# Patient Record
Sex: Female | Born: 1960 | Race: Asian | Hispanic: No | Smoking: Never smoker
Health system: Southern US, Community
[De-identification: ages and names within clinical notes are randomized; demographics above are authoritative.]

## PROBLEM LIST (undated history)

## (undated) DIAGNOSIS — M1990 Unspecified osteoarthritis, unspecified site: Secondary | ICD-10-CM

## (undated) DIAGNOSIS — E7800 Pure hypercholesterolemia, unspecified: Secondary | ICD-10-CM

## (undated) DIAGNOSIS — C3490 Malignant neoplasm of unspecified part of unspecified bronchus or lung: Secondary | ICD-10-CM

## (undated) DIAGNOSIS — C50919 Malignant neoplasm of unspecified site of unspecified female breast: Secondary | ICD-10-CM

## (undated) HISTORY — PX: NEPHRECTOMY TRANSPLANTED ORGAN: SUR880

---

## 2021-10-15 ENCOUNTER — Emergency Department (HOSPITAL_COMMUNITY): Payer: PRIVATE HEALTH INSURANCE

## 2021-10-15 ENCOUNTER — Other Ambulatory Visit: Payer: Self-pay

## 2021-10-15 ENCOUNTER — Emergency Department (HOSPITAL_COMMUNITY)
Admission: EM | Admit: 2021-10-15 | Discharge: 2021-10-15 | Disposition: A | Discharge status: Home / Self Care | Payer: PRIVATE HEALTH INSURANCE | Attending: Emergency Medicine | Admitting: Emergency Medicine

## 2021-10-15 ENCOUNTER — Encounter (HOSPITAL_COMMUNITY): Payer: Self-pay

## 2021-10-15 DIAGNOSIS — M545 Low back pain, unspecified: Secondary | ICD-10-CM | POA: Insufficient documentation

## 2021-10-15 HISTORY — DX: Malignant neoplasm of unspecified part of unspecified bronchus or lung: C34.90

## 2021-10-15 HISTORY — DX: Unspecified osteoarthritis, unspecified site: M19.90

## 2021-10-15 HISTORY — DX: Pure hypercholesterolemia, unspecified: E78.00

## 2021-10-15 HISTORY — DX: Malignant neoplasm of unspecified site of unspecified female breast: C50.919

## 2021-10-15 MED ORDER — LIDOCAINE 5 % EX PTCH: 1.0000 | MEDICATED_PATCH | CUTANEOUS | 0 refills | Status: AC

## 2021-10-15 MED ORDER — HYDROMORPHONE HCL 1 MG/ML IJ SOLN
1.0000 mg | Freq: Once | INTRAMUSCULAR | Status: AC
  Administered 2021-10-15: 1 mg via INTRAMUSCULAR
  Filled 2021-10-15: qty 1

## 2021-10-15 MED ORDER — LIDOCAINE 5 % EX PTCH
1.0000 | MEDICATED_PATCH | CUTANEOUS | Status: DC
  Administered 2021-10-15: 1 via TRANSDERMAL
  Filled 2021-10-15: qty 1

## 2021-10-15 MED ORDER — PREDNISONE 20 MG PO TABS
40.0000 mg | ORAL_TABLET | Freq: Every day | ORAL | 0 refills | Status: AC
Start: 2021-10-15 — End: ?

## 2021-10-15 MED ORDER — OXYCODONE-ACETAMINOPHEN 5-325 MG PO TABS
1.0000 | ORAL_TABLET | Freq: Once | ORAL | Status: AC
  Administered 2021-10-15: 1 via ORAL
  Filled 2021-10-15: qty 1

## 2021-10-15 MED ORDER — CYCLOBENZAPRINE HCL 10 MG PO TABS
10.0000 mg | ORAL_TABLET | Freq: Three times a day (TID) | ORAL | 0 refills | Status: AC | PRN
Start: 2021-10-15 — End: ?

## 2021-10-15 NOTE — ED Provider Notes (Signed)
Bethany Burch  Arrival date & time: 10/15/21     Chief Complaint   Back Pain   History of Present Illness   Bethany Burch is a 61 y.o. year-old female presents to the ED with chief complaint of right sided low back pain.  She states that he grandson jumped on her low back about 3 days ago.  She has had gradually worsening pain.  She has tried ice, heat, naproxen, and Voltaren without relief.  She denies bowel or bladder incontinence.  Denies any weakness, numbness, or tingling, but does state that she has need some assistance walking because of the pain.  History provided by patient.   Review of Systems  Pertinent review of systems noted in HPI.    Physical Exam   Vitals:   10/15/21 2015 10/15/21 2215  BP: (!) 156/100 (!) 147/89  Pulse: 77 66  Resp: 19 19  Temp: (!) 97.5 F (36.4 C)   SpO2: 94% 99%    CONSTITUTIONAL:  non toxic-appearing, NAD NEURO:  Alert and oriented x 3, CN 3-12 grossly intact EYES:  eyes equal and reactive ENT/NECK:  Supple, no stridor  CARDIO:  normal rate, appears well-perfused, intact dorsalis pedis pulses PULM:  No respiratory distress, CTAB, no wheezing GI/GU:  non-distended,  MSK/SPINE:  No gross deformities, moderate right lumbar paraspinal muscle tenderness, no step off, able to lift and hold alternating legs off of the bed, normal strength in lower extremities, normal sensation in feet SKIN:  no rash, atraumatic   *Additional and/or pertinent findings included in MDM below  Diagnostic and Interventional Summary    EKG Interpretation  Date/Time:    Ventricular Rate:    PR Interval:    QRS Duration:   QT Interval:    QTC Calculation:   R Axis:     Text Interpretation:         Labs Reviewed - No data to display  CT Lumbar Spine Wo Contrast  Final Result      Medications  HYDROmorphone (DILAUDID) injection 1 mg (has no administration in time range)   lidocaine (LIDODERM) 5 % 1 patch (has no administration in time range)  oxyCODONE-acetaminophen (PERCOCET/ROXICET) 5-325 MG per tablet 1 tablet (1 tablet Oral Given 10/15/21 2104)     Procedures  /  Critical Care Procedures  ED Course and Medical Decision Making  I have reviewed the triage vital signs, the nursing notes, and pertinent available records from the EMR.  Social Determinants Affecting Complexity of Care: Patient has no clinically significant social determinants affecting this chief complaint..   ED Course:   Patient here with low back pain.  Top differential diagnoses include muscle strain, lumbar radiculopathy, contusion. Medical Decision Making Patient here with right low back pain x 3 days after her grandson jumped on her back.  CT is negative for fracture, but is notable for stenosis and mild L4/L5 disc bulge.  Will trial short course of prednisone and flexeril.  No evidence of cauda equina.  Recommend they follow-up with pcp or spine.  Problems Addressed: Acute right-sided low back pain without sciatica: acute illness or injury  Amount and/or Complexity of Data Reviewed Radiology: independent interpretation performed.    Details: no obvious fx  Risk Prescription drug management.     Consultants: No consultations were needed in caring for this patient.   Treatment and Plan: Emergency department workup does not suggest an emergent condition requiring admission or immediate intervention beyond  what has been performed at this time. The patient is safe for discharge and has  been instructed to return immediately for worsening symptoms, change in  symptoms or any other concerns    Final Clinical Impressions(s) / ED Diagnoses     ICD-10-CM   1. Acute right-sided low back pain without sciatica  M54.50       ED Discharge Orders          Ordered    lidocaine (LIDODERM) 5 %  Every 24 hours        10/15/21 2239    cyclobenzaprine (FLEXERIL) 10 MG tablet  3  times daily PRN        10/15/21 2239    predniSONE (DELTASONE) 20 MG tablet  Daily        10/15/21 2239              Discharge Instructions Discussed with and Provided to Patient:    Discharge Instructions      No fracture was seen on the CT scan.  There is some stenosis, or narrowing of the bones which can put pressure on nerves. You may have a small herniated disc.  You will need to follow-up with the back specialist listed.   Take medications as directed.  The muscle relaxer can cause drowsiness.  It is important that you stay mobile so that your muscles don't tighten up and cause your pain to be worse.      Montine Circle, PA-C 10/15/21 2246    Horton, Alvin Critchley, DO 10/15/21 2311

## 2021-10-15 NOTE — Discharge Instructions (Addendum)
No fracture was seen on the CT scan.  There is some stenosis, or narrowing of the bones which can put pressure on nerves. You may have a small herniated disc.  You will need to follow-up with the back specialist listed.   Take medications as directed.  The muscle relaxer can cause drowsiness.  It is important that you stay mobile so that your muscles don't tighten up and cause your pain to be worse.

## 2021-10-15 NOTE — ED Triage Notes (Signed)
Pt BIB [facility] with complaints of back pain since yesterday morning after her 62 yr old grandson jumped on her right lower back.

## 2021-10-15 NOTE — ED Provider Triage Note (Addendum)
Emergency Medicine Provider Triage Evaluation Note  Bethany Burch , a 61 y.o. female  was evaluated in triage.  Pt complains of back pain. States that yesterday afternoon she was playing with her 51 year old grandson who jumped on her back. She endorses immediate severe pain in this area. States that initially she was able to walk with minimal assistance but today she has been barely able to move because the pain is so severe. She states that it is located in her low back and on her right side. She denies any sharp shooting pain down her legs or numbness/tingling in her extremities. Denies any loss of bowel or bladder function or saddle paraesthesias.   Review of Systems  Positive:  Negative:   Physical Exam  BP (!) 156/100   Pulse 77   Temp (!) 97.5 F (36.4 C) (Oral)   Resp 19   Ht 5\' 4"  (1.626 m)   Wt 99.8 kg   SpO2 94%   BMI 37.76 kg/m  Gen:   Awake, some distress,   Resp:  Normal effort  MSK:   Moves extremities without difficulty  Other:  Tenderness to palpation of the lumbar spine without obvious stepoffs or deformity. Associated paraspinous muscle tenderness noted on the right side  Medical Decision Making  Medically screening exam initiated at 8:40 PM.  Appropriate orders placed.  Aggie Hacker was informed that the remainder of the evaluation will be completed by another provider, this initial triage assessment does not replace that evaluation, and the importance of remaining in the ED until their evaluation is complete.     Nestor Lewandowsky 10/15/21 2044    Bud Face, PA-C 10/15/21 2045

## 2023-07-06 IMAGING — CT CT L SPINE W/O CM
3 of 4 series · 13 of 33 positions shown, 16 images · non-contrast
Comparison: None Available.

CLINICAL DATA: Back pain



[Series 4: l spine st · axial · 0.39mm/px · z∈[-392,-220]mm · 5 of 130 slices shown, 7 images]
[im 22/130  soft-tissue]
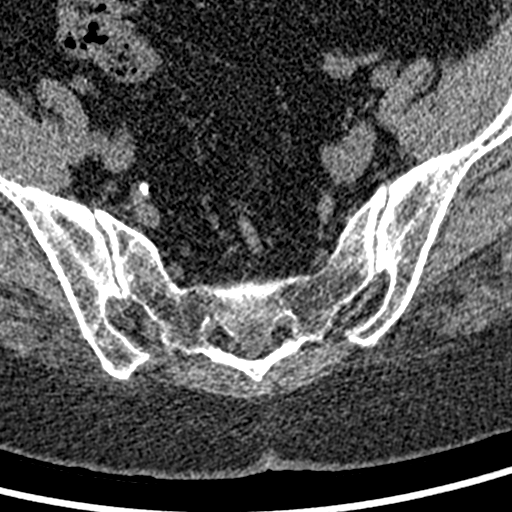
[im 22/130  bone]
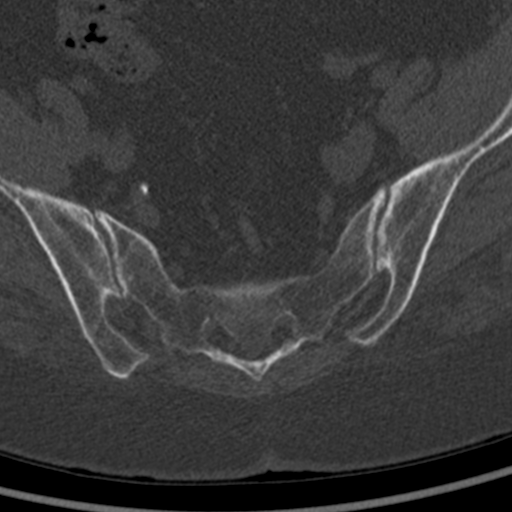
[im 44/130  bone]
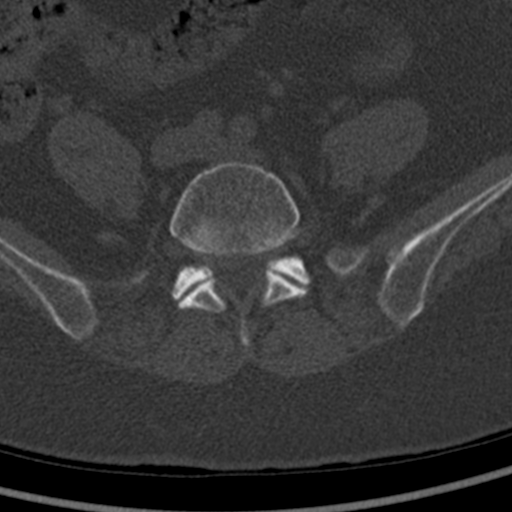
[im 65/130  bone]
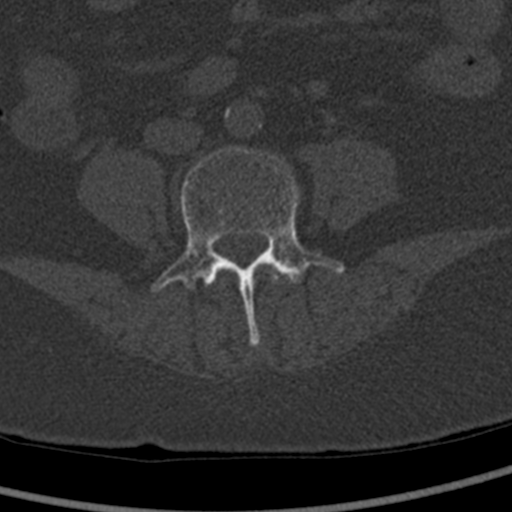
[im 87/130  bone]
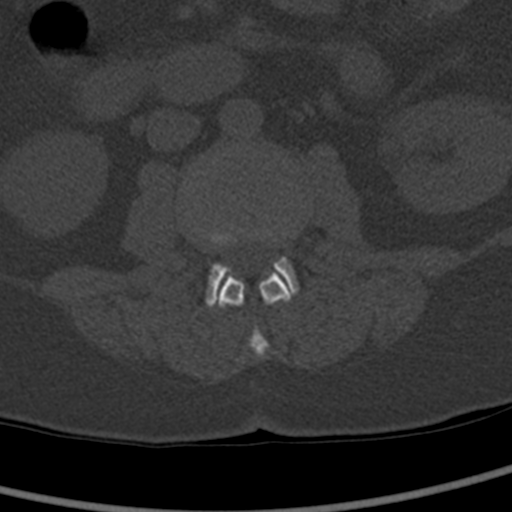
[im 108/130  soft-tissue]
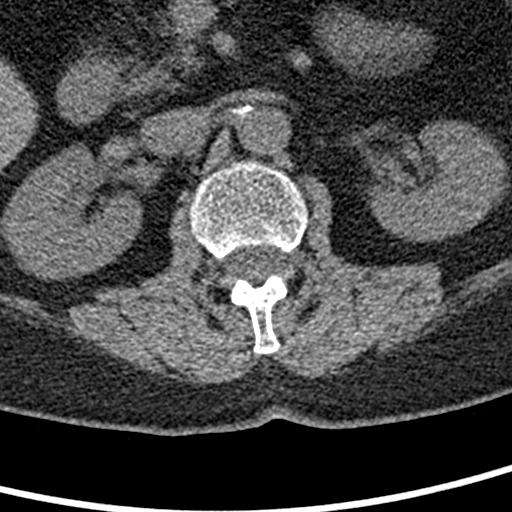
[im 108/130  bone]
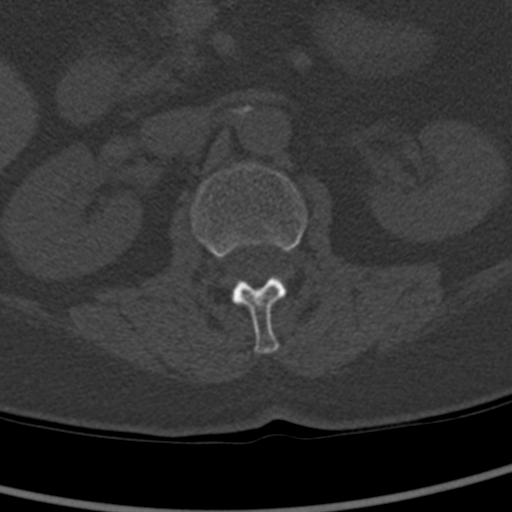

[Series 8: coronal bone · coronal · 0.38mm/px · 3 of 77 slices shown]
[im 16/77  bone]
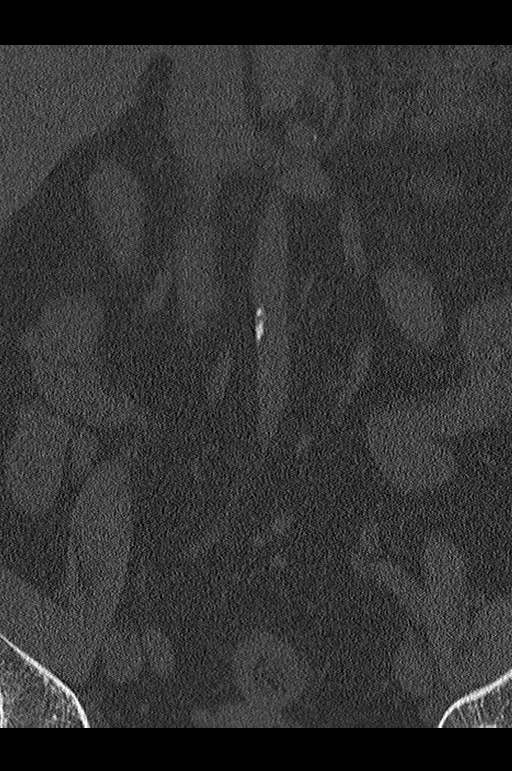
[im 31/77  bone]
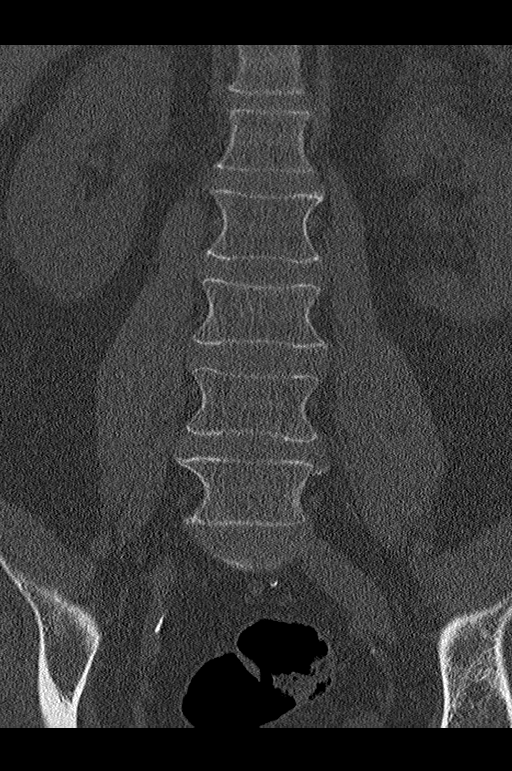
[im 46/77  bone]
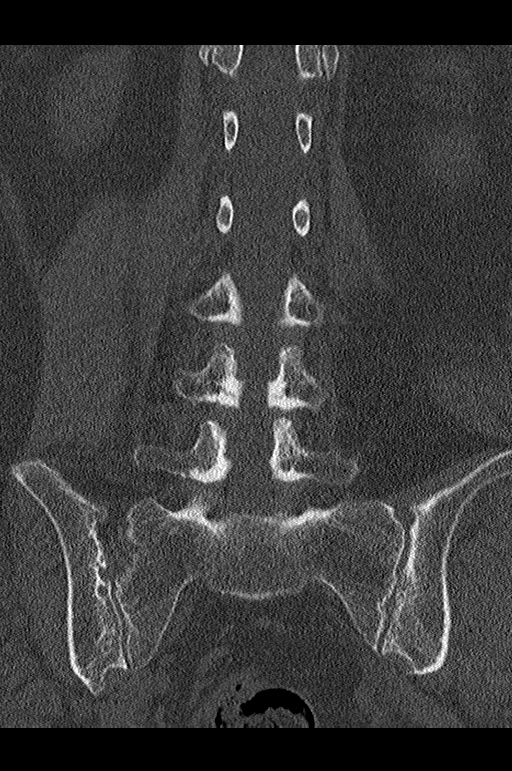

[Series 10: sagittal st · sagittal · 0.46mm/px · 5 of 87 slices shown, 6 images]
[im 29/87  bone]
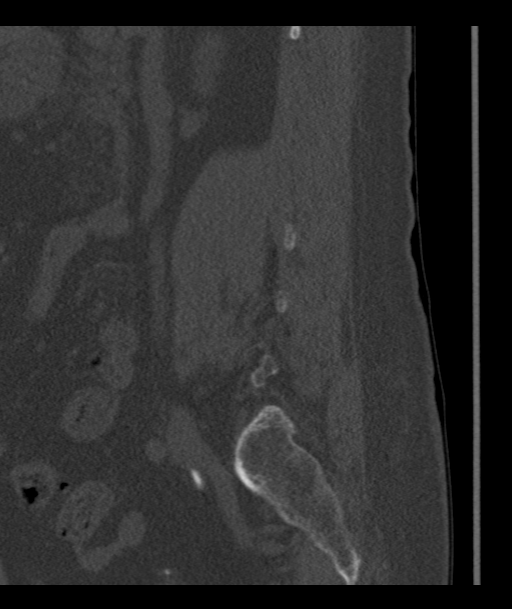
[im 36/87  bone]
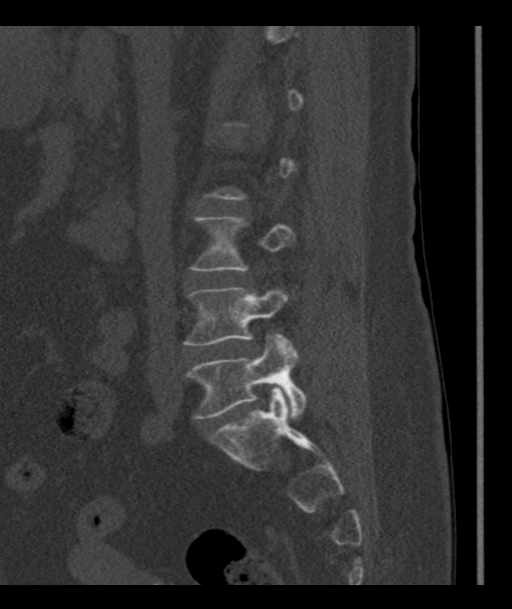
[im 44/87  soft-tissue]
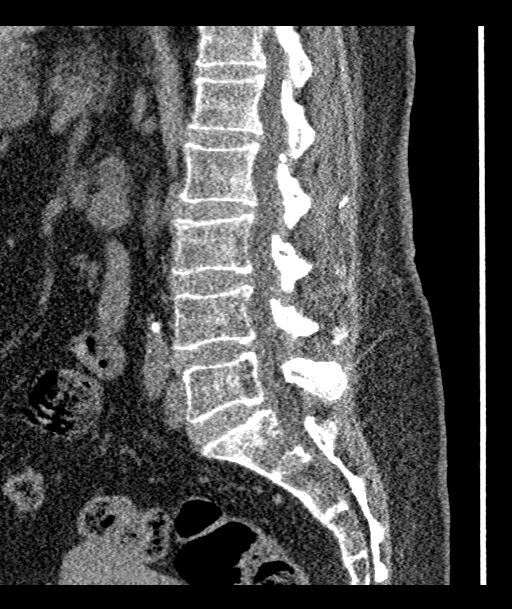
[im 44/87  bone]
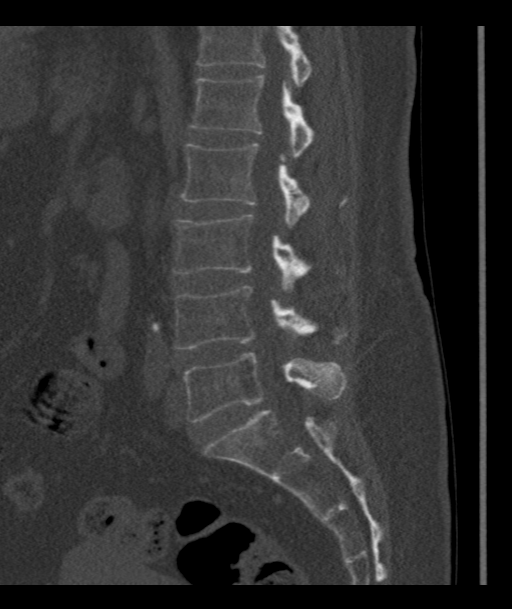
[im 51/87  bone]
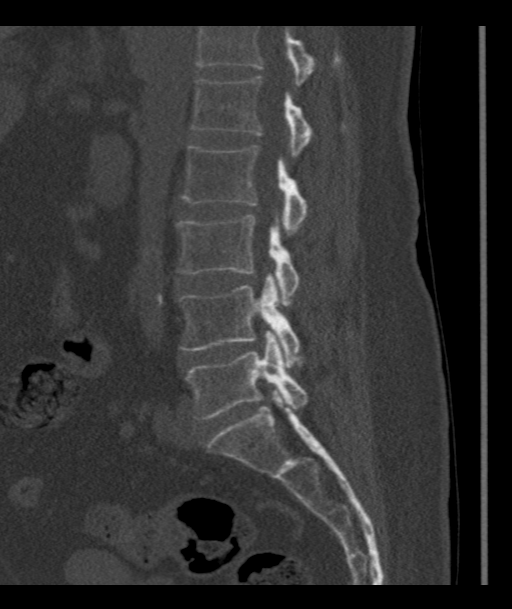
[im 58/87  bone]
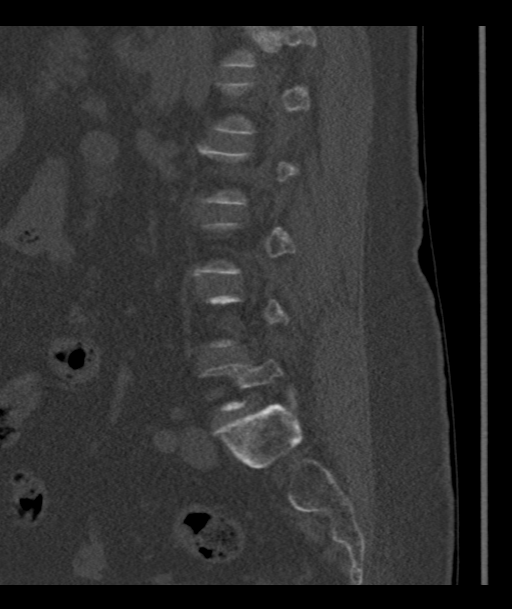

[13 of 33 positions shown; findings below may reference images not displayed]

FINDINGS: Segmentation: 5 lumbar type vertebrae.

Alignment: Straightening of the normal lumbar lordosis. No
listhesis.

Vertebrae: No acute fracture or suspicious osseous lesion.

Paraspinal and other soft tissues: Aortic atherosclerosis. Otherwise
negative.

Disc levels:

No significant spinal canal stenosis or neural foraminal narrowing
at T12-L4 and L5-S1.

L4-L5: Mild disc bulge. Mild facet arthropathy. Ligamentum flavum
hypertrophy. Mild spinal canal stenosis and mild left neural
foraminal narrowing.
IMPRESSION: 1. No acute fracture or suspicious osseous lesion.
2. L4-L5 mild spinal canal stenosis and mild left neural foraminal
narrowing.
